# Patient Record
Sex: Female | Born: 1950 | Race: White | Hispanic: No | Smoking: Never smoker
Health system: Southern US, Community
[De-identification: ages and names within clinical notes are randomized; demographics above are authoritative.]

## PROBLEM LIST (undated history)

## (undated) DIAGNOSIS — I10 Essential (primary) hypertension: Secondary | ICD-10-CM

## (undated) HISTORY — PX: ABDOMINAL HYSTERECTOMY: SHX81

## (undated) HISTORY — PX: REDUCTION MAMMAPLASTY: SUR839

---

## 2010-12-02 ENCOUNTER — Ambulatory Visit: Payer: Self-pay | Admitting: Unknown Physician Specialty

## 2010-12-04 LAB — PATHOLOGY REPORT

## 2011-04-09 ENCOUNTER — Ambulatory Visit: Payer: Self-pay | Admitting: Internal Medicine

## 2012-10-25 ENCOUNTER — Ambulatory Visit: Payer: Self-pay | Admitting: Internal Medicine

## 2013-09-15 DIAGNOSIS — K219 Gastro-esophageal reflux disease without esophagitis: Secondary | ICD-10-CM | POA: Insufficient documentation

## 2013-09-15 DIAGNOSIS — F32A Depression, unspecified: Secondary | ICD-10-CM | POA: Insufficient documentation

## 2013-09-15 DIAGNOSIS — I10 Essential (primary) hypertension: Secondary | ICD-10-CM | POA: Insufficient documentation

## 2014-01-31 ENCOUNTER — Other Ambulatory Visit: Payer: Self-pay

## 2014-01-31 DIAGNOSIS — Z1231 Encounter for screening mammogram for malignant neoplasm of breast: Secondary | ICD-10-CM

## 2014-02-03 ENCOUNTER — Emergency Department: Payer: Self-pay | Admitting: Emergency Medicine

## 2014-02-03 LAB — BASIC METABOLIC PANEL
Anion Gap: 7 (ref 7–16)
BUN: 10 mg/dL (ref 7–18)
CHLORIDE: 107 mmol/L (ref 98–107)
CO2: 28 mmol/L (ref 21–32)
CREATININE: 0.65 mg/dL (ref 0.60–1.30)
Calcium, Total: 8.5 mg/dL (ref 8.5–10.1)
EGFR (African American): >60
Glucose: 106 mg/dL — ABNORMAL HIGH (ref 65–99)
OSMOLALITY: 283 (ref 275–301)
Potassium: 3.9 mmol/L (ref 3.5–5.1)
Sodium: 142 mmol/L (ref 136–145)

## 2014-02-03 LAB — CBC
HCT: 39.2 % (ref 35.0–47.0)
HGB: 12.9 g/dL (ref 12.0–16.0)
MCH: 28.7 pg (ref 26.0–34.0)
MCHC: 32.8 g/dL (ref 32.0–36.0)
MCV: 88 fL (ref 80–100)
Platelet: 276 10*3/uL (ref 150–440)
RBC: 4.48 10*6/uL (ref 3.80–5.20)
RDW: 13.7 % (ref 11.5–14.5)
WBC: 10.2 10*3/uL (ref 3.6–11.0)

## 2014-02-03 LAB — TROPONIN I: Troponin-I: <0.02 ng/mL

## 2014-02-05 ENCOUNTER — Emergency Department: Payer: Self-pay | Admitting: Emergency Medicine

## 2014-02-07 DIAGNOSIS — R0602 Shortness of breath: Secondary | ICD-10-CM | POA: Insufficient documentation

## 2014-02-07 DIAGNOSIS — R079 Chest pain, unspecified: Secondary | ICD-10-CM | POA: Insufficient documentation

## 2014-03-09 ENCOUNTER — Ambulatory Visit
Admission: RE | Admit: 2014-03-09 | Discharge: 2014-03-09 | Disposition: A | Discharge status: Home / Self Care | Payer: Medicare Other | Source: Ambulatory Visit

## 2014-03-09 ENCOUNTER — Encounter (INDEPENDENT_AMBULATORY_CARE_PROVIDER_SITE_OTHER): Payer: Self-pay

## 2014-03-09 DIAGNOSIS — Z1231 Encounter for screening mammogram for malignant neoplasm of breast: Secondary | ICD-10-CM

## 2014-06-26 DIAGNOSIS — R5383 Other fatigue: Secondary | ICD-10-CM | POA: Insufficient documentation

## 2015-08-26 DIAGNOSIS — M4310 Spondylolisthesis, site unspecified: Secondary | ICD-10-CM | POA: Insufficient documentation

## 2015-08-26 DIAGNOSIS — M47812 Spondylosis without myelopathy or radiculopathy, cervical region: Secondary | ICD-10-CM | POA: Insufficient documentation

## 2015-09-07 ENCOUNTER — Emergency Department
Admission: EM | Admit: 2015-09-07 | Discharge: 2015-09-07 | Disposition: A | Discharge status: Home / Self Care | Payer: Medicare HMO | Attending: Student | Admitting: Student

## 2015-09-07 ENCOUNTER — Encounter: Payer: Self-pay | Admitting: Emergency Medicine

## 2015-09-07 ENCOUNTER — Emergency Department: Payer: Medicare HMO

## 2015-09-07 DIAGNOSIS — M542 Cervicalgia: Secondary | ICD-10-CM | POA: Diagnosis present

## 2015-09-07 DIAGNOSIS — I10 Essential (primary) hypertension: Secondary | ICD-10-CM | POA: Diagnosis not present

## 2015-09-07 DIAGNOSIS — M436 Torticollis: Secondary | ICD-10-CM | POA: Diagnosis not present

## 2015-09-07 HISTORY — DX: Essential (primary) hypertension: I10

## 2015-09-07 MED ORDER — METHOCARBAMOL 500 MG PO TABS: 500.0000 mg | ORAL_TABLET | Freq: Four times a day (QID) | ORAL | Status: DC

## 2015-09-07 MED ORDER — HYDROMORPHONE HCL 1 MG/ML IJ SOLN
1.0000 mg | Freq: Once | INTRAMUSCULAR | Status: AC
  Administered 2015-09-07: 1 mg via INTRAMUSCULAR
  Filled 2015-09-07: qty 1

## 2015-09-07 MED ORDER — METHOCARBAMOL 500 MG PO TABS
500.0000 mg | ORAL_TABLET | Freq: Once | ORAL | Status: AC
  Administered 2015-09-07: 500 mg via ORAL
  Filled 2015-09-07: qty 1

## 2015-09-07 MED ORDER — KETOROLAC TROMETHAMINE 60 MG/2ML IM SOLN
30.0000 mg | Freq: Once | INTRAMUSCULAR | Status: AC
  Administered 2015-09-07: 30 mg via INTRAMUSCULAR
  Filled 2015-09-07: qty 2

## 2015-09-07 MED ORDER — OXYCODONE-ACETAMINOPHEN 5-325 MG PO TABS: 1.0000 | ORAL_TABLET | Freq: Four times a day (QID) | ORAL | Status: DC | PRN

## 2015-09-07 NOTE — ED Notes (Signed)
Patient states neck pain for several days. Unable to move neck without severe pain.  Patient given warm blanket along with medications.

## 2015-09-07 NOTE — Discharge Instructions (Signed)
Acute Torticollis °Torticollis is a condition in which the muscles of the neck tighten (contract) abnormally, causing the neck to twist and the head to move into an unnatural position. Torticollis that develops suddenly is called acute torticollis. If torticollis becomes chronic and is left untreated, the face and neck can become deformed. °CAUSES °This condition may be caused by: °· Sleeping in an awkward position (common). °· Extending or twisting the neck muscles beyond their normal position. °· Infection. °In some cases, the cause may not be known. °SYMPTOMS °Symptoms of this condition include: °· An unnatural position of the head. °· Neck pain. °· A limited ability to move the neck. °· Twisting of the neck to one side. °DIAGNOSIS °This condition is diagnosed with a physical exam. You may also have imaging tests, such as an X-ray, CT scan, or MRI. °TREATMENT °Treatment for this condition involves trying to relax the neck muscles. It may include: °· Medicines or shots. °· Physical therapy. °· Surgery. This may be done in severe cases. °HOME CARE INSTRUCTIONS °· Take medicines only as directed by your health care provider. °· Do stretching exercises and massage your neck as directed by your health care provider. °· Keep all follow-up visits as directed by your health care provider. This is important. °SEEK MEDICAL CARE IF: °· You develop a fever. °SEEK IMMEDIATE MEDICAL CARE IF: °· You develop difficulty breathing. °· You develop noisy breathing (stridor). °· You start drooling. °· You have trouble swallowing or have pain with swallowing. °· You develop numbness or weakness in your hands or feet. °· You have changes in your speech, understanding, or vision. °· Your pain gets worse. °  °This information is not intended to replace advice given to you by your health care provider. Make sure you discuss any questions you have with your health care provider. °  °Document Released: 04/03/2000 Document Revised:  08/21/2014 Document Reviewed: 04/02/2014 °Elsevier Interactive Patient Education ©2016 [employer] ° °

## 2015-09-07 NOTE — ED Notes (Signed)
Spasmlike neck pain x 1 week, denies injury at onset.

## 2015-09-07 NOTE — ED Provider Notes (Signed)
[facility] Emergency Department Provider Note   ____________________________________________  Time seen: Approximately 4:55 PM  I have reviewed the triage vital signs and the nursing notes.   HISTORY  Chief Complaint Neck Pain    HPI Murial Beam is a 65 y.o. female patient complain one half weeks of spasmodic neck pain to the right lateral aspect of the neck. Patient stated pain radiates from her neck up to the occipital area on the right side of her scalp. Patient states she's been using muscle relaxers without any definitive relief. Patient denies any radicular component to her pain. Patient was recently seen by orthopedics 1 week ago for this complaint. No imaging was taken. Patient rates her pain as a 8/10 and describes the pain as "spasmatic".   Past Medical History  Diagnosis Date  . Hypertension     There are no active problems to display for this patient.   Past Surgical History  Procedure Laterality Date  . Abdominal hysterectomy      Current Outpatient Rx  Name  Route  Sig  Dispense  Refill  . methocarbamol (ROBAXIN) 500 MG tablet   Oral   Take 1 tablet (500 mg total) by mouth 4 (four) times daily.   20 tablet   0   . oxyCODONE-acetaminophen (ROXICET) 5-325 MG tablet   Oral   Take 1 tablet by mouth every 6 (six) hours as needed for moderate pain.   12 tablet   0     Allergies Review of patient's allergies indicates no known allergies.  No family history on file.  Social History Social History  Substance Use Topics  . Smoking status: Never Smoker   . Smokeless tobacco: None  . Alcohol Use: No    Review of Systems Constitutional: No fever/chills Eyes: No visual changes. ENT: No sore throat. Cardiovascular: Denies chest pain. Respiratory: Denies shortness of breath. Gastrointestinal: No abdominal pain.  No nausea, no vomiting.  No diarrhea.  No constipation. Genitourinary: Negative for dysuria. Musculoskeletal:  Neck pain Skin: Negative for rash. Neurological: Negative for headaches, focal weakness or numbness. Endocrine:Hypertension ____________________________________________   PHYSICAL EXAM:  VITAL SIGNS: ED Triage Vitals  Enc Vitals Group     BP 09/07/15 1515 153/96 mmHg     Pulse Rate 09/07/15 1515 65     Resp 09/07/15 1515 18     Temp 09/07/15 1515 98.2 F (36.8 C)     Temp Source 09/07/15 1515 Oral     SpO2 09/07/15 1515 94 %     Weight 09/07/15 1515 200 lb (90.719 kg)     Height 09/07/15 1515  (1.6 m)     Head Cir --      Peak Flow --      Pain Score --      Pain Loc --      Pain Edu? --      Excl. in GC? --     Constitutional: Alert and oriented. Well appearing and in no acute distress. Eyes: Conjunctivae are normal. PERRL. EOMI. Head: Atraumatic. Nose: No congestion/rhinnorhea. Mouth/Throat: Mucous membranes are moist.  Oropharynx non-erythematous. Neck: No stridor.  No cervical spine tenderness to palpation.Decreased left lateral and flexion movements. Hematological/Lymphatic/Immunilogical: No cervical lymphadenopathy. Cardiovascular: Normal rate, regular rhythm. Grossly normal heart sounds.  Good peripheral circulation. Respiratory: Normal respiratory effort.  No retractions. Lungs CTAB. Gastrointestinal: Soft and nontender. No distention. No abdominal bruits. No CVA tenderness. Musculoskeletal: No lower extremity tenderness nor edema.  No joint effusions. Neurologic:  Normal speech and language. No gross focal neurologic deficits are appreciated. No gait instability. Skin:  Skin is warm, dry and intact. No rash noted. Psychiatric: Mood and affect are normal. Speech and behavior are normal.  ____________________________________________   LABS (all labs ordered are listed, but only abnormal results are displayed)  Labs Reviewed - No data to  display ____________________________________________  EKG   ____________________________________________  RADIOLOGY  No subluxation or fracture. Findings consistent with spasms of the neck. ____________________________________________   PROCEDURES  Procedure(s) performed: None  Critical Care performed: No  ____________________________________________   INITIAL IMPRESSION / ASSESSMENT AND PLAN / ED COURSE  Pertinent labs & imaging results that were available during my care of the patient were reviewed by me and considered in my medical decision making (see chart for details).  Torticollis. Discussed x-ray finding with patient. Patient given a prescription for Robaxin and Percocet. Patient given discharge Instructions. Patient advised to [facility]-up with family [facility] if condition persists. ____________________________________________   FINAL CLINICAL IMPRESSION(S) / ED DIAGNOSES  Final diagnoses:  Torticollis, acute      NEW MEDICATIONS STARTED DURING THIS VISIT:  New Prescriptions   METHOCARBAMOL (ROBAXIN) 500 MG TABLET    Take 1 tablet (500 mg total) by mouth 4 (four) times daily.   OXYCODONE-ACETAMINOPHEN (ROXICET) 5-325 MG TABLET    Take 1 tablet by mouth every 6 (six) hours as needed for moderate pain.     Note:  This document was prepared using Dragon voice recognition software and may include unintentional dictation errors.      Joni Reiningonald K Primitivo Merkey, PA-C 09/07/15 1838  Gayla DossEryka A Gayle, MD 09/08/15 [phone]

## 2018-05-02 DIAGNOSIS — R002 Palpitations: Secondary | ICD-10-CM | POA: Insufficient documentation

## 2018-07-04 DIAGNOSIS — E1169 Type 2 diabetes mellitus with other specified complication: Secondary | ICD-10-CM | POA: Insufficient documentation

## 2019-10-31 ENCOUNTER — Other Ambulatory Visit: Payer: Self-pay | Admitting: Family Medicine

## 2019-11-07 ENCOUNTER — Other Ambulatory Visit: Payer: Self-pay | Admitting: Family Medicine

## 2019-11-07 DIAGNOSIS — Z1231 Encounter for screening mammogram for malignant neoplasm of breast: Secondary | ICD-10-CM

## 2019-11-10 ENCOUNTER — Ambulatory Visit
Admission: RE | Admit: 2019-11-10 | Discharge: 2019-11-10 | Disposition: A | Discharge status: Home / Self Care | Payer: Medicare Other | Source: Ambulatory Visit | Attending: Family Medicine | Admitting: Family Medicine

## 2019-11-10 DIAGNOSIS — Z1231 Encounter for screening mammogram for malignant neoplasm of breast: Secondary | ICD-10-CM | POA: Insufficient documentation

## 2021-01-31 IMAGING — MG DIGITAL SCREENING BILAT W/ TOMO W/ CAD
6 of 12 series · 6 of 36 positions shown · non-contrast
Comparison: Previous exam(s).

CLINICAL DATA: Screening.

EXAM:
DIGITAL SCREENING BILATERAL MAMMOGRAM WITH TOMO AND CAD

[L CC synth-2D]
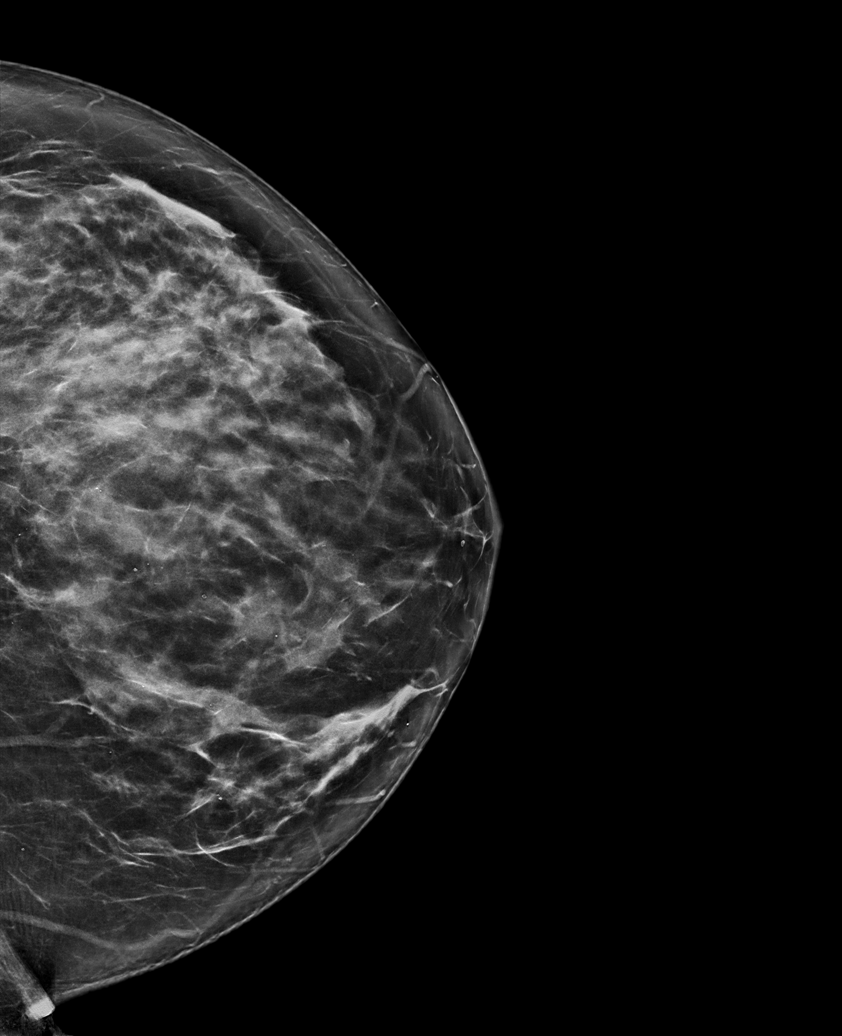

[R MLO synth-2D (1 of 2)]
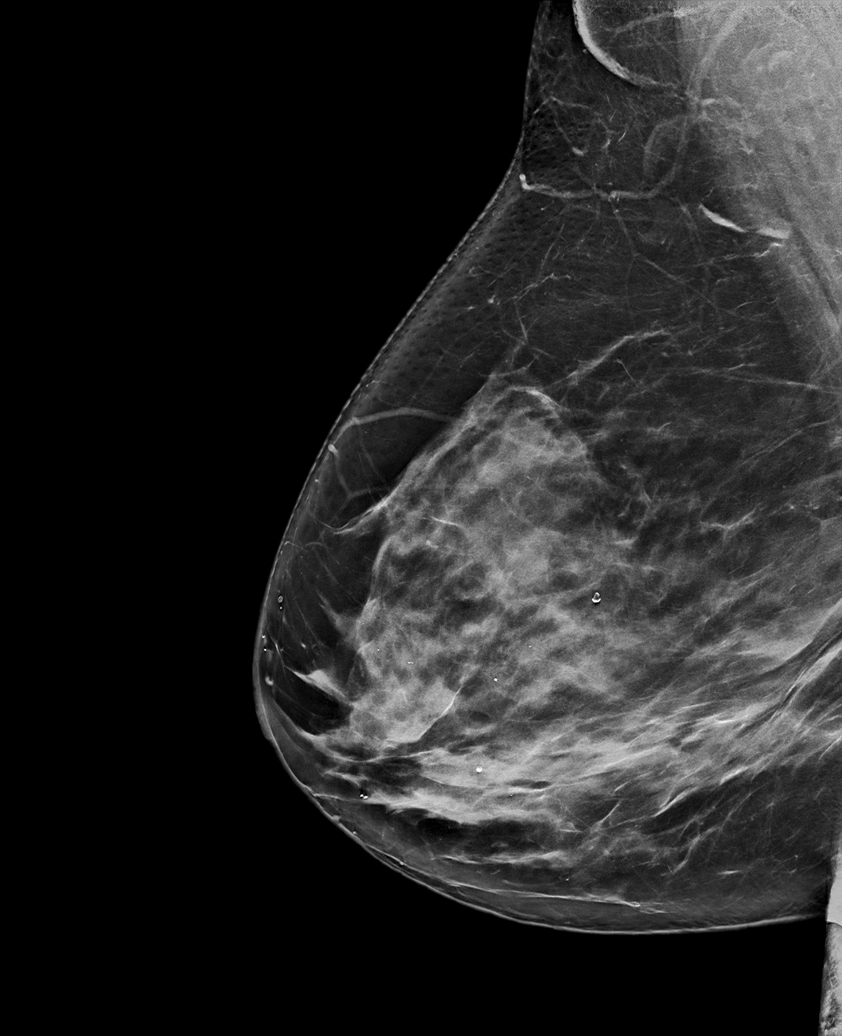

[L MLO synth-2D (1 of 2)]
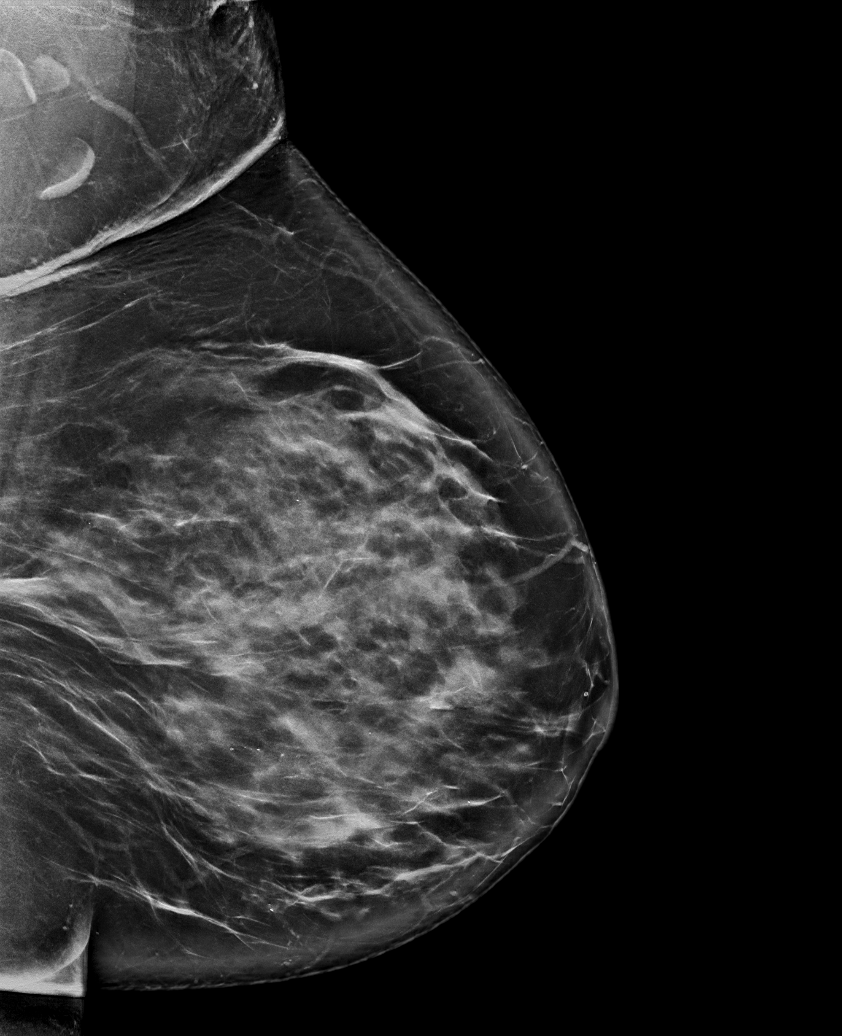

[R MLO synth-2D (2 of 2)]
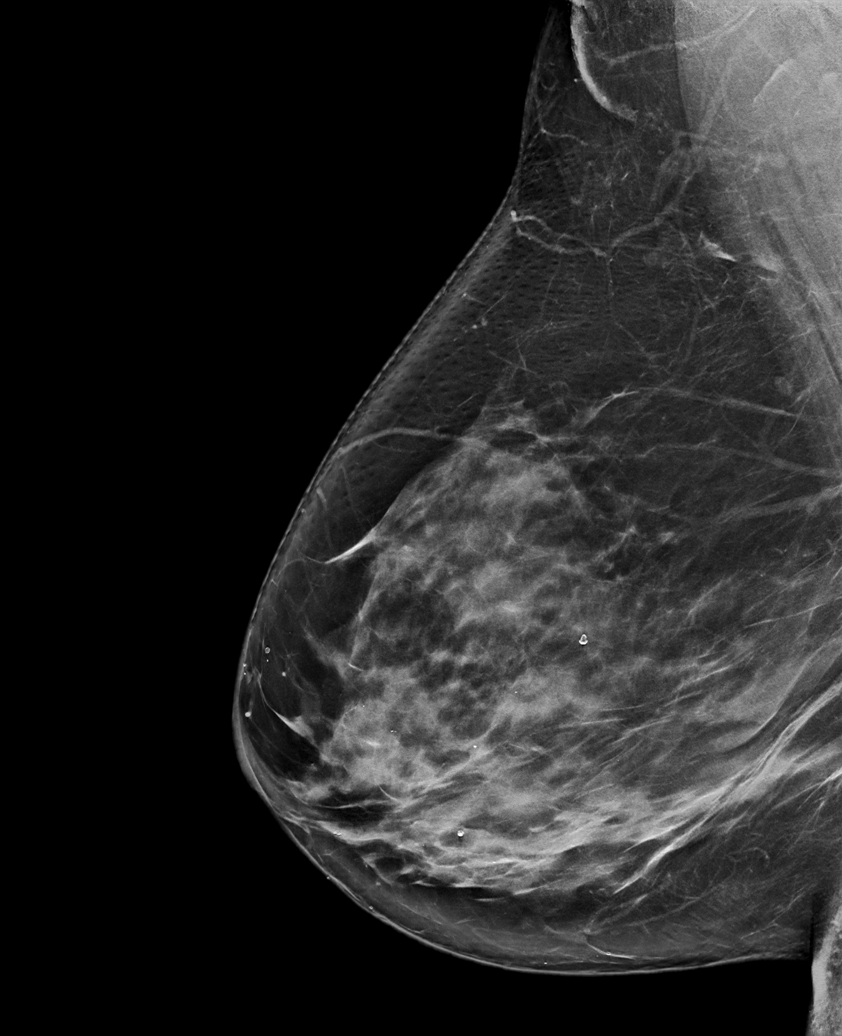

[L MLO synth-2D (2 of 2)]
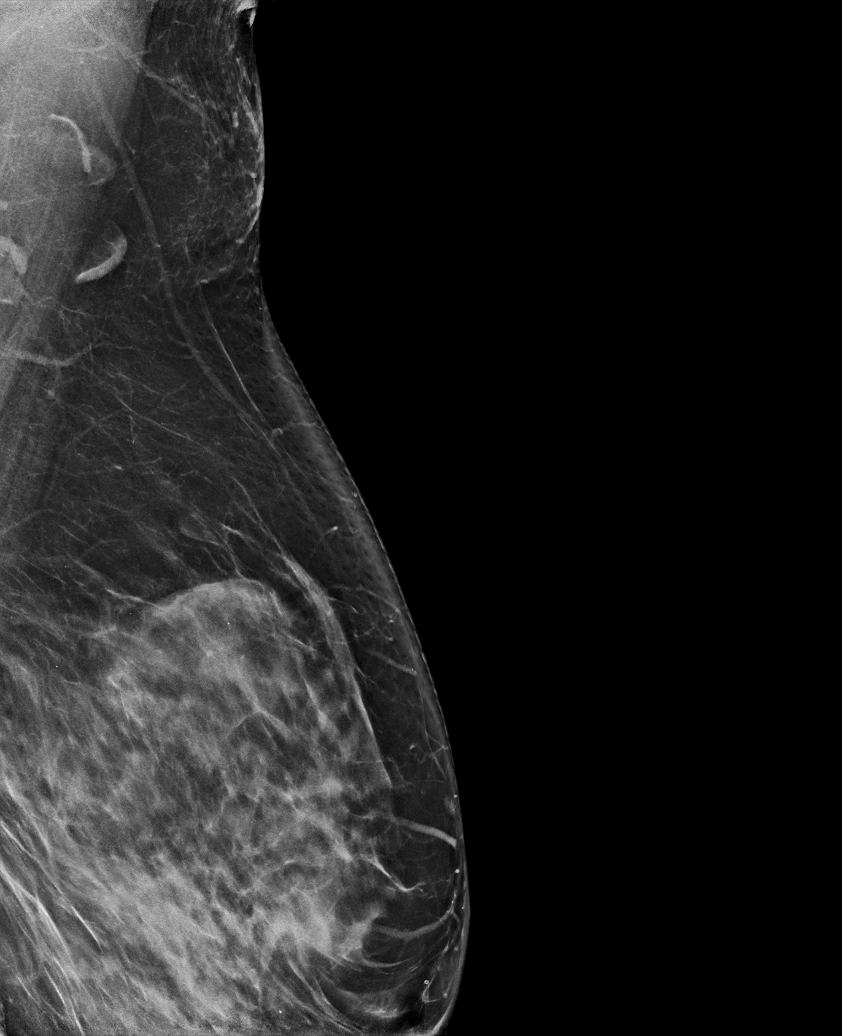

[R CC synth-2D]
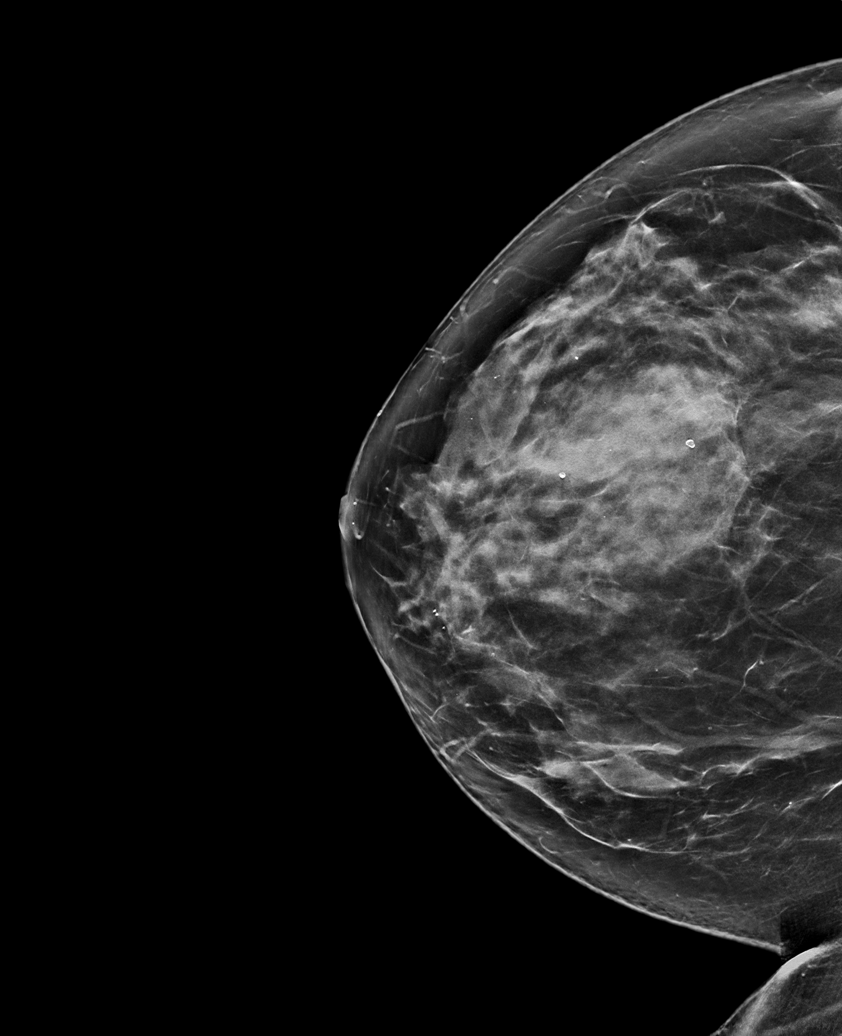

[6 of 36 positions shown; findings below may reference images not displayed]

ACR Breast Density Category c: The breast tissue is heterogeneously
dense, which may obscure small masses.
FINDINGS: There are no findings suspicious for malignancy. Images were
processed with CAD.
IMPRESSION: No mammographic evidence of malignancy. A result letter of this
screening mammogram will be mailed directly to the patient.

RECOMMENDATION:
Screening mammogram in one year. (Code:FT-U-LHB)

BI-RADS CATEGORY  1: Negative.

## 2022-06-19 LAB — COLOGUARD: COLOGUARD: NEGATIVE

## 2022-06-19 LAB — EXTERNAL GENERIC LAB PROCEDURE: COLOGUARD: NEGATIVE

## 2023-01-19 ENCOUNTER — Emergency Department
Admission: EM | Admit: 2023-01-19 | Discharge: 2023-01-19 | Disposition: A | Discharge status: Home / Self Care | Payer: Medicare Other | Attending: Emergency Medicine | Admitting: Emergency Medicine

## 2023-01-19 ENCOUNTER — Emergency Department: Payer: Medicare Other

## 2023-01-19 ENCOUNTER — Other Ambulatory Visit: Payer: Self-pay

## 2023-01-19 DIAGNOSIS — W1830XA Fall on same level, unspecified, initial encounter: Secondary | ICD-10-CM | POA: Diagnosis not present

## 2023-01-19 DIAGNOSIS — S93601A Unspecified sprain of right foot, initial encounter: Secondary | ICD-10-CM | POA: Diagnosis not present

## 2023-01-19 DIAGNOSIS — S82831A Other fracture of upper and lower end of right fibula, initial encounter for closed fracture: Secondary | ICD-10-CM | POA: Insufficient documentation

## 2023-01-19 DIAGNOSIS — M25571 Pain in right ankle and joints of right foot: Secondary | ICD-10-CM | POA: Diagnosis present

## 2023-01-19 MED ORDER — TRAMADOL HCL 50 MG PO TABS: 50.0000 mg | ORAL_TABLET | Freq: Three times a day (TID) | ORAL | 0 refills | Status: AC | PRN

## 2023-01-19 NOTE — Discharge Instructions (Addendum)
You have a fracture of the fibula bone of the ankle. You have been placed in a walking boot for support. Use the boot and the walker to ambulate with weight-bearing as tolerated. Rest with the foot elevated and apply ice packs to reduce swelling. Take the prescription pain medicine as needed. Take OTC Tylenol for non-drowsy pain relief. Follow-up with [facility] for further fracture management.

## 2023-01-19 NOTE — ED Triage Notes (Signed)
Pt here after a fall today. Pt states she slipped on wet concrete but did not hit her head. Pt right ankle is swollen but pt can still move her toes. Pt denies any other issues.

## 2023-01-20 NOTE — ED Provider Notes (Signed)
[facility] Emergency Department Provider Note     Event Date/Time   First MD Initiated Contact with Patient 01/19/23 1516     (approximate)   History   Foot Pain   HPI  Alexandra Mann is a 72 y.o. female with a noncontributory medical history, presents to the ED for evaluation of injury sustained following a fall today.  She slipped on wet concrete but denies any head injury or LOC but she presents with right ankle pain and swelling.  She denies any other injury at this time.   Physical Exam   Triage Vital Signs: ED Triage Vitals  Encounter Vitals Group     BP 01/19/23 1308 (!) 148/74     Systolic BP Percentile --      Diastolic BP Percentile --      Pulse Rate 01/19/23 1308 74     Resp 01/19/23 1308 17     Temp 01/19/23 1308 98.7 F (37.1 C)     Temp Source 01/19/23 1308 Oral     SpO2 01/19/23 1308 98 %     Weight 01/19/23 1307 199 lb 15.3 oz (90.7 kg)     Height 01/19/23 1307 5\' 3"  (1.6 m)     Head Circumference --      Peak Flow --      Pain Score 01/19/23 1307 0     Pain Loc --      Pain Education --      Exclude from Growth Chart --     Most recent vital signs: Vitals:   01/19/23 1308  BP: (!) 148/74  Pulse: 74  Resp: 17  Temp: 98.7 F (37.1 C)  SpO2: 98%    General Awake, no distress. NAD HEENT NCAT. PERRL. EOMI. No rhinorrhea. Mucous membranes are moist. CV:  Good peripheral perfusion.  RESP:  Normal effort.  ABD:  No distention.  MSK:  Right ankle without obvious deformity or dislocation.  Lateral soft tissue swelling is noted.   ED Results / Procedures / Treatments   Labs (all labs ordered are listed, but only abnormal results are displayed) Labs Reviewed - No data to display   EKG   RADIOLOGY  I personally viewed and evaluated these images as part of my medical decision making, as well as reviewing the written report by the radiologist.  ED Provider Interpretation: Oblique, nondisplaced distal fibular  fracture  DG Right Ankle   IMPRESSION: Acute minimally displaced distal fibular diaphyseal and metaphyseal fracture.  PROCEDURES:  Critical Care performed: No  Procedures   MEDICATIONS ORDERED IN ED: Medications - No data to display   IMPRESSION / MDM / ASSESSMENT AND PLAN / ED COURSE  I reviewed the triage vital signs and the nursing notes.                              Differential diagnosis includes, but is not limited to, sprain, ankle fracture, ankle dislocation, foot sprain  Patient's presentation is most consistent with acute complicated illness / injury requiring diagnostic workup.  Patient's diagnosis is consistent with acute right ankle sprain with distal fibular fracture.  Fracture morphology is confirmed on plain films interpreted by me.  Patient will be placed in a cam boot for ambulation.  Patient will be discharged home with prescriptions for Medrol. Patient is to follow up with podiatry for further fracture care as discussed as needed or otherwise directed. Patient is given ED  precautions to return to the ED for any worsening or new symptoms.  FINAL CLINICAL IMPRESSION(S) / ED DIAGNOSES   Final diagnoses:  Sprain of right foot, initial encounter  Other closed fracture of distal end of right fibula, initial encounter     Rx / DC Orders   ED Discharge Orders          Ordered    traMADol (ULTRAM) 50 MG tablet  3 times daily PRN        01/19/23 1536             Note:  This document was prepared using Dragon voice recognition software and may include unintentional dictation errors.    Lissa Hoard, PA-C 01/20/23 2317    Shaune Pollack, MD 01/21/23 [phone]

## 2023-11-17 ENCOUNTER — Ambulatory Visit: Admitting: Urology

## 2023-11-17 ENCOUNTER — Encounter: Payer: Self-pay | Admitting: Urology

## 2023-11-17 VITALS — BP 166/94 | HR 99 | Ht 63.0 in | Wt 199.0 lb

## 2023-11-17 DIAGNOSIS — R399 Unspecified symptoms and signs involving the genitourinary system: Secondary | ICD-10-CM | POA: Diagnosis not present

## 2023-11-17 NOTE — Progress Notes (Signed)
    I, Alexandra Mann, acting as a scribe for Alexandra JAYSON Barba, MD., have documented all relevant documentation on the behalf of Alexandra JAYSON Barba, MD, as directed by Alexandra JAYSON Barba, MD while in the presence of Alexandra JAYSON Barba, MD.  11/17/2023 5:51 PM   Alexandra Mann 03/15/1951 [id number]  Referring provider: Diedra Lame, MD [address] [facility] [place],  [place] [address]  Chief Complaint  Patient presents with   Establish Care   Urinary Incontinence    HPI: Alexandra Mann is a 73 y.o. female referred for evaluation of lower urinary tract symptoms.  Seen [facility] urgent care 11/03/23 complaining of dysuria and sensation of incomplete bladder emptying. Urinalysis was unremarkable and urine culture was negative. Urology evaluation was recommended. The following day, she was seen at her PCP office complaining of bladder pressure, dysuria, and sensation of incomplete emptying since 10/24/23. No gross hematuria or flank pain.  Urinalysis was unremarkable and she was given Rx for Pyridium. She states since that visit, her voiding symptoms have resolved. She does have chronic constipation, which has also improved. She has no bothersome lower urinary tract symptoms at present.   PMH: Past Medical History:  Diagnosis Date   Hypertension     Surgical History: Past Surgical History:  Procedure Laterality Date   ABDOMINAL HYSTERECTOMY     REDUCTION MAMMAPLASTY Bilateral 40 yrs ago    Home Medications:  Allergies as of 11/17/2023   No Known Allergies      Medication List        Accurate as of November 17, 2023  5:51 PM. If you have any questions, ask your nurse or doctor.          Alpha-Lipoic Acid 600 MG Tabs Take 600 mg by mouth daily.   metoprolol succinate 100 MG 24 hr tablet Commonly known as: TOPROL-XL Take 100 mg by mouth daily. Take with or immediately following a meal.        Allergies: No Known Allergies  Social  History:  reports that she has never smoked. She does not have any smokeless tobacco history on file. She reports that she does not drink alcohol. No history on file for drug use.   Physical Exam: BP (!) 166/94   Pulse 99   Ht 5' 3 (1.6 m)   Wt 199 lb (90.3 kg)   BMI 35.25 kg/m   Constitutional:  Alert and oriented, No acute distress. HEENT:  AT, moist mucus membranes.  Trachea midline, no masses. Cardiovascular: No clubbing, cyanosis, or edema. Respiratory: Normal respiratory effort, no increased work of breathing. GI: Abdomen is soft, nontender, nondistended, no abdominal masses Skin: No rashes, bruises or suspicious lesions. Neurologic: Grossly intact, no focal deficits, moving all 4 extremities. Psychiatric: Normal mood and affect.   Assessment & Plan:    1. Lower urinary tract symptoms Was having voiding difficulty, which may have been related to constipation. Her symptoms have resolved PVR today 0 mL She will return as needed for recurrent symptoms.  [facility] [address], [address] [place], [place] [address] [phone]

## 2023-11-29 ENCOUNTER — Ambulatory Visit: Payer: Medicare Other | Admitting: Family Medicine

## 2024-06-07 ENCOUNTER — Ambulatory Visit: Admitting: Family Medicine
